# Patient Record
Sex: Female | Born: 1965 | Race: Black or African American | Hispanic: No
Health system: Southern US, Community
[De-identification: ages and names within clinical notes are randomized; demographics above are authoritative.]

## PROBLEM LIST (undated history)

## (undated) DIAGNOSIS — I10 Essential (primary) hypertension: Secondary | ICD-10-CM

## (undated) DIAGNOSIS — R569 Unspecified convulsions: Secondary | ICD-10-CM

## (undated) DIAGNOSIS — E119 Type 2 diabetes mellitus without complications: Secondary | ICD-10-CM

---

## 2019-04-03 ENCOUNTER — Encounter (HOSPITAL_COMMUNITY): Payer: Self-pay

## 2019-04-03 ENCOUNTER — Other Ambulatory Visit: Payer: Self-pay

## 2019-04-03 ENCOUNTER — Emergency Department (HOSPITAL_COMMUNITY)
Admission: EM | Admit: 2019-04-03 | Discharge: 2019-04-03 | Disposition: A | Discharge status: Home / Self Care | Payer: Medicaid - Out of State | Attending: Emergency Medicine | Admitting: Emergency Medicine

## 2019-04-03 ENCOUNTER — Emergency Department (HOSPITAL_COMMUNITY): Payer: Medicaid - Out of State

## 2019-04-03 DIAGNOSIS — I10 Essential (primary) hypertension: Secondary | ICD-10-CM | POA: Insufficient documentation

## 2019-04-03 DIAGNOSIS — E119 Type 2 diabetes mellitus without complications: Secondary | ICD-10-CM | POA: Diagnosis not present

## 2019-04-03 DIAGNOSIS — S138XXA Sprain of joints and ligaments of other parts of neck, initial encounter: Secondary | ICD-10-CM | POA: Insufficient documentation

## 2019-04-03 DIAGNOSIS — Y92410 Unspecified street and highway as the place of occurrence of the external cause: Secondary | ICD-10-CM | POA: Insufficient documentation

## 2019-04-03 DIAGNOSIS — Y93I9 Activity, other involving external motion: Secondary | ICD-10-CM | POA: Diagnosis not present

## 2019-04-03 DIAGNOSIS — Y999 Unspecified external cause status: Secondary | ICD-10-CM | POA: Diagnosis not present

## 2019-04-03 DIAGNOSIS — G44319 Acute post-traumatic headache, not intractable: Secondary | ICD-10-CM | POA: Insufficient documentation

## 2019-04-03 DIAGNOSIS — S139XXA Sprain of joints and ligaments of unspecified parts of neck, initial encounter: Secondary | ICD-10-CM

## 2019-04-03 DIAGNOSIS — S199XXA Unspecified injury of neck, initial encounter: Secondary | ICD-10-CM | POA: Diagnosis present

## 2019-04-03 HISTORY — DX: Unspecified convulsions: R56.9

## 2019-04-03 HISTORY — DX: Type 2 diabetes mellitus without complications: E11.9

## 2019-04-03 HISTORY — DX: Essential (primary) hypertension: I10

## 2019-04-03 LAB — POC URINE PREG, ED: Preg Test, Ur: NEGATIVE

## 2019-04-03 MED ORDER — METHOCARBAMOL 500 MG PO TABS: 500.0000 mg | ORAL_TABLET | Freq: Two times a day (BID) | ORAL | 0 refills | Status: AC

## 2019-04-03 MED ORDER — HYDROCODONE-ACETAMINOPHEN 5-325 MG PO TABS
1.0000 | ORAL_TABLET | Freq: Once | ORAL | Status: AC
  Administered 2019-04-03: 1 via ORAL
  Filled 2019-04-03: qty 1
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
## 2019-04-03 NOTE — Discharge Instructions (Signed)
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
## 2019-04-03 NOTE — ED Notes (Signed)
Discharge instructions and prescription discussed with Pt. Pt verbalized understanding. Pt stable and ambulatory.    

## 2019-04-03 NOTE — ED Notes (Signed)
Patient transported to CT 

## 2019-08-24 NOTE — ED Notes (Signed)
ED Patient Education Note     Patient Education Materials Follows:  Nutrition     Migraine Headache    A migraine headache is very bad, throbbing pain on one or both sides of your head. Talk to your doctor about what things may bring on (trigger) your migraine headaches.      HOME CARE     Only take medicines as told by your doctor.     Lie down in a dark, quiet room when you have a migraine.     Keep a journal to find out if certain things bring on migraine headaches. For example, write down:    ? What you eat and drink.    ? How much sleep you get.    ? Any change to your diet or medicines.     Lessen how much alcohol you drink.     Quit smoking if you smoke.     Get enough sleep.     Lessen any stress in your life.     Keep lights dim if bright lights bother you or make your migraines worse.    GET HELP RIGHT AWAY IF:     Your migraine becomes really bad.     You have a fever.     You have a stiff neck.     You have trouble seeing.     Your muscles are weak, or you lose muscle control.     You lose your balance or have trouble walking.     You feel like you will pass out (faint), or you pass out.     You have really bad symptoms that are different than your first symptoms.    MAKE SURE YOU:     Understand these instructions.      Will watch your condition.     Will get help right away if you are not doing well or get worse.    This information is not intended to replace advice given to you by your health care provider. Make sure you discuss any questions you have with your health care provider.    Document Released: 03/09/2008 Document Revised: 08/23/2011 Document Reviewed: 02/05/2013  Elsevier Interactive Patient Education ?2016 [employer]      Ophthalmology     Bacterial Conjunctivitis    Bacterial conjunctivitis, commonly called pink eye, is an inflammation of the clear membrane that covers the white part of the eye (conjunctiva). The inflammation can also happen on the underside of the eyelids. The blood  vessels in the conjunctiva become inflamed, causing the eye to become red or pink. Bacterial conjunctivitis may spread easily from one eye to another and from person to person (contagious).       CAUSES    Bacterial conjunctivitis is caused by bacteria. The bacteria may come from your own skin, your upper respiratory tract, or from someone else with bacterial conjunctivitis.    SYMPTOMS    The normally white color of the eye or the underside of the eyelid is usually pink or red. The pink eye is usually associated with irritation, tearing, and some sensitivity to light. Bacterial conjunctivitis is often associated with a thick, yellowish discharge from the eye. The discharge may turn into a crust on the eyelids overnight, which causes your eyelids to stick together. If a discharge is present, there may also be some blurred vision in the affected eye.    DIAGNOSIS    Bacterial conjunctivitis is diagnosed by your caregiver through an  eye exam and the symptoms that you report. Your caregiver looks for changes in the surface tissues of your eyes, which may point to the specific type of conjunctivitis. A sample of any discharge may be collected on a cotton-tip swab if you have a severe case of conjunctivitis, if your cornea is affected, or if you keep getting repeat infections that do not respond to treatment. The sample will be sent to a lab to see if the inflammation is caused by a bacterial infection and to see if the infection will respond to antibiotic medicines.    TREATMENT     Bacterial conjunctivitis is treated with antibiotics. Antibiotic eyedrops are most often used. However, antibiotic ointments are also available. Antibiotics pills are sometimes used. Artificial tears or eye washes may ease discomfort.    HOME CARE INSTRUCTIONS     To ease discomfort, apply a cool, clean washcloth to your eye for 10?20 minutes, 3?4 times a day.     Gently wipe away any drainage from your eye with a warm, wet washcloth or a  cotton ball.     Wash your hands often with soap and water. Use paper towels to dry your hands.     Do not share towels or washcloths. This may spread the infection.     Change or wash your pillowcase every day.     You should not use eye makeup until the infection is gone.     Do not operate machinery or drive if your vision is blurred.     Stop using contact lenses. Ask your caregiver how to sterilize or replace your contacts before using them again. This depends on the type of contact lenses that you use.     When applying medicine to the infected eye, do not touch the edge of your eyelid with the eyedrop bottle or ointment tube.    SEEK IMMEDIATE MEDICAL CARE IF:     Your infection has not improved within 3 days after beginning treatment.     You had yellow discharge from your eye and it returns.     You have increased eye pain.     Your eye redness is spreading.     Your vision becomes blurred.     You have a fever or persistent symptoms for more than 2?3 days.     You have a fever and your symptoms suddenly get worse.     You have facial pain, redness, or swelling.    MAKE SURE YOU:     Understand these instructions.      Will watch your condition.     Will get help right away if you are not doing well or get worse.    This information is not intended to replace advice given to you by your health care provider. Make sure you discuss any questions you have with your health care provider.    Document Released: 05/31/2005 Document Revised: 06/21/2014 Document Reviewed: 03/13/2015  Elsevier Interactive Patient Education ?2016 Elsevier Inc.

## 2019-08-24 NOTE — ED Notes (Signed)
ED Triage Note       ED Triage Adult Entered On:  08/24/2019 2:16 EST    Performed On:  08/24/2019 2:11 EST by Elinor Parkinson, RN, Eduard Clos               Triage   Chief Complaint :   Pt c/o left eye pressure pain & redness. Pt states she had a headache at home, started about 6:30. Pt took prescribed Immatrex. States headache pain decreased but pressure on left side of left eye began about 8:30; Worsens with position.    Numeric Rating Pain Scale :   8   [place] Mode of Arrival :   Private vehicle   Infectious Disease Documentation :   Document assessment   Heart Rate Monitored :   73 bpm   Respiratory Rate :   17 br/min   Systolic Blood Pressure :   315 mmHg   Diastolic Blood Pressure :   84 mmHg   SpO2 :   99 %   Oxygen Therapy :   Room air   Patient presentation :   None of the above   Chief Complaint or Presentation suggest infection :   No   Weight Dosing :   88.9 kg(Converted to: 196 lb 0 oz)    Height :   162 cm(Converted to: 5 ft 4 in)    Body Mass Index Dosing :   34 kg/m2   Elinor Parkinson, RNEduard Clos - 08/24/2019 2:11 EST   DCP GENERIC CODE   Tracking Acuity :   3   Tracking Group :   ED [employer], RN, Eduard Clos - 08/24/2019 2:11 EST   ED General Section :   Document assessment   Pregnancy Status :   Patient denies   Last Menstrual Period :   08/07/2019 EST   ED Allergies Section :   Document assessment   ED Reason for Visit Section :   Document assessment   Cross, RN, Eduard Clos - 08/24/2019 2:11 EST   ID Risk Screen Symptoms   Recent Travel History :   No recent travel   Close Contact with COVID-19 ID :   No   Last 14 days COVID-19 ID :   No   TB Symptom Screen :   No symptoms   C. diff Symptom/History ID :   Neither of the above   Elinor Parkinson, RN, [place] A - 08/24/2019 2:11 EST   Allergies   (As Of: 08/24/2019 02:16:58 EST)   Allergies (Active)   sulfa drugs  Estimated Onset Date:   Unspecified ; Reactions:   Rash ; Created By:   Elinor Parkinson RN, Eduard Clos; Reaction Status:   Active ; Category:   Drug ;  Substance:   sulfa drugs ; Type:   Allergy ; Severity:   Moderate ; Updated By:   Elinor Parkinson RN, Eduard Clos; Reviewed Date:   08/24/2019 2:12 EST        Psycho-Social   Last 3 mo, thoughts killing self/others :   Patient denies   Elinor Parkinson, RNEduard Clos - 08/24/2019 2:11 EST   ED Reason for Visit   (As Of: 08/24/2019 02:16:58 EST)   Diagnoses(Active)    Eye pain  Date:   08/24/2019 ; Diagnosis Type:   Reason For Visit ; Confirmation:   Complaint of ; Clinical Dx:   Eye pain ; Classification:   Medical ; Clinical Service:   Emergency medicine ; Code:  PNED ; Probability:   0 ; Diagnosis Code:   [id number]      Headache  Date:   08/24/2019 ; Diagnosis Type:   Reason For Visit ; Confirmation:   Complaint of ; Clinical Dx:   Headache ; Classification:   Medical ; Clinical Service:   Emergency medicine ; Code:   PNED ; Probability:   0 ; Diagnosis Code:   [phone]

## 2019-08-24 NOTE — ED Notes (Signed)
ED Patient Education Note     Patient Education Materials Follows:  Nutrition     Migraine Headache    A migraine headache is very bad, throbbing pain on one or both sides of your head. Talk to your doctor about what things may bring on (trigger) your migraine headaches.      HOME CARE     Only take medicines as told by your doctor.     Lie down in a dark, quiet room when you have a migraine.     Keep a journal to find out if certain things bring on migraine headaches. For example, write down:    ? What you eat and drink.    ? How much sleep you get.    ? Any change to your diet or medicines.     Lessen how much alcohol you drink.     Quit smoking if you smoke.     Get enough sleep.     Lessen any stress in your life.     Keep lights dim if bright lights bother you or make your migraines worse.    GET HELP RIGHT AWAY IF:     Your migraine becomes really bad.     You have a fever.     You have a stiff neck.     You have trouble seeing.     Your muscles are weak, or you lose muscle control.     You lose your balance or have trouble walking.     You feel like you will pass out (faint), or you pass out.     You have really bad symptoms that are different than your first symptoms.    MAKE SURE YOU:     Understand these instructions.      Will watch your condition.     Will get help right away if you are not doing well or get worse.    This information is not intended to replace advice given to you by your health care provider. Make sure you discuss any questions you have with your health care provider.    Document Released: 03/09/2008 Document Revised: 08/23/2011 Document Reviewed: 02/05/2013  Elsevier Interactive Patient Education ?2016 Elsevier Inc.      Ophthalmology     Bacterial Conjunctivitis    Bacterial conjunctivitis, commonly called pink eye, is an inflammation of the clear membrane that covers the white part of the eye (conjunctiva). The inflammation can also happen on the underside of the eyelids. The blood  vessels in the conjunctiva become inflamed, causing the eye to become red or pink. Bacterial conjunctivitis may spread easily from one eye to another and from person to person (contagious).       CAUSES    Bacterial conjunctivitis is caused by bacteria. The bacteria may come from your own skin, your upper respiratory tract, or from someone else with bacterial conjunctivitis.    SYMPTOMS    The normally white color of the eye or the underside of the eyelid is usually pink or red. The pink eye is usually associated with irritation, tearing, and some sensitivity to light. Bacterial conjunctivitis is often associated with a thick, yellowish discharge from the eye. The discharge may turn into a crust on the eyelids overnight, which causes your eyelids to stick together. If a discharge is present, there may also be some blurred vision in the affected eye.    DIAGNOSIS    Bacterial conjunctivitis is diagnosed by your caregiver through an  eye exam and the symptoms that you report. Your caregiver looks for changes in the surface tissues of your eyes, which may point to the specific type of conjunctivitis. A sample of any discharge may be collected on a cotton-tip swab if you have a severe case of conjunctivitis, if your cornea is affected, or if you keep getting repeat infections that do not respond to treatment. The sample will be sent to a lab to see if the inflammation is caused by a bacterial infection and to see if the infection will respond to antibiotic medicines.    TREATMENT     Bacterial conjunctivitis is treated with antibiotics. Antibiotic eyedrops are most often used. However, antibiotic ointments are also available. Antibiotics pills are sometimes used. Artificial tears or eye washes may ease discomfort.    HOME CARE INSTRUCTIONS     To ease discomfort, apply a cool, clean washcloth to your eye for 10?20 minutes, 3?4 times a day.     Gently wipe away any drainage from your eye with a warm, wet washcloth or a  cotton ball.     Wash your hands often with soap and water. Use paper towels to dry your hands.     Do not share towels or washcloths. This may spread the infection.     Change or wash your pillowcase every day.     You should not use eye makeup until the infection is gone.     Do not operate machinery or drive if your vision is blurred.     Stop using contact lenses. Ask your caregiver how to sterilize or replace your contacts before using them again. This depends on the type of contact lenses that you use.     When applying medicine to the infected eye, do not touch the edge of your eyelid with the eyedrop bottle or ointment tube.    SEEK IMMEDIATE MEDICAL CARE IF:     Your infection has not improved within 3 days after beginning treatment.     You had yellow discharge from your eye and it returns.     You have increased eye pain.     Your eye redness is spreading.     Your vision becomes blurred.     You have a fever or persistent symptoms for more than 2?3 days.     You have a fever and your symptoms suddenly get worse.     You have facial pain, redness, or swelling.    MAKE SURE YOU:     Understand these instructions.      Will watch your condition.     Will get help right away if you are not doing well or get worse.    This information is not intended to replace advice given to you by your health care provider. Make sure you discuss any questions you have with your health care provider.    Document Released: 05/31/2005 Document Revised: 06/21/2014 Document Reviewed: 03/13/2015  Elsevier Interactive Patient Education ?2016 [employer].

## 2019-08-24 NOTE — ED Provider Notes (Signed)
Headache        Patient:   Becky Zimmerman, Becky Zimmerman            MRN: [id number]            FIN: [id number]               Age:   54 years     Sex:  Female     DOB:  [birth year]   Associated Diagnoses:   Migraine headache; Conjunctivitis, acute   Author:   Hilda Lias      Basic Information   Additional information: Chief Complaint from Nursing Triage Note   Chief Complaint  Chief Complaint: Pt c/o left eye pressure pain & redness. Pt states she had a headache at home, started about 6:30. Pt took prescribed Immatrex. States headache pain decreased but pressure on left side of left eye began about 8:30; Worsens with position. (08/24/19 02:11:00).      History of Present Illness   The patient presents with 19-year-old female with a history of migraine headaches had a migraine headache and also noticed some redness and pain in her left eye.  Little bit of tearing.  Hurts when she blinks.  She denies fevers or chills or nausea vomiting.  She took some Imitrex her headache is improved but was worried about her eye..        Review of Systems   Constitutional symptoms:  No fever, no chills.    Eye symptoms:  Vision unchanged, discharge, no diplopia, no blurred vision.    ENMT symptoms:  No ear pain, no sore throat.    Respiratory symptoms:  No shortness of breath, no cough.    Cardiovascular symptoms:  No chest pain, no palpitations.    Gastrointestinal symptoms:  No vomiting, no diarrhea.    Genitourinary symptoms:  No dysuria, no hematuria.    Neurologic symptoms:  No numbness, no tingling, no focal weakness.              Additional review of systems information: All other systems reviewed and otherwise negative.      Health Status   Allergies:    Allergic Reactions (Selected)  Moderate  Sulfa drugs- Rash..      Past Medical/ Family/ Social History   Medical history: Reviewed as documented in chart.   Surgical history: Reviewed as documented in chart.   Family history: Not significant.   Social history: Reviewed as documented  in chart.   Problem list:    Active Problems (3)  Diabetes   Heart palpitations   HTN (hypertension)   .      Physical Examination               Vital Signs   Vital Signs   12/11/5282 1:32 EST Systolic Blood Pressure 440 mmHg    Diastolic Blood Pressure 84 mmHg    Heart Rate Monitored 73 bpm    Respiratory Rate 17 br/min    SpO2 99 %   .   Measurements   08/24/2019 2:16 EST Body Mass Index est meas 33.87 kg/m2    Body Mass Index Measured 33.87 kg/m2   08/24/2019 2:11 EST Height/Length Measured 162 cm    Weight Dosing 88.9 kg   .   Basic Oxygen Information   08/24/2019 2:11 EST Oxygen Therapy Room air    SpO2 99 %   .   General:  Alert, no acute distress.    Skin:  Warm, dry, no rash.  Head:  Atraumatic.   Neck:  Supple, trachea midline.    Eye:  Pupils are equal, round and reactive to light, intact accommodation, extraocular movements are intact, vision unchanged, Method of inspection: Viewed with fluorescein, Tetracaine applied, Eyelids: Normal, Conjunctiva: With conjunctivitis, erythematous, Cornea: Clear, no abrasions, not cloudy, no corneal opacification, Intraocular pressure: Left, 9  mm Hg, not elevated, Retina: Retinal arteries no occlusion, retinal veins no venous pulsations, without retinal hemorrhage, Optic nerve: no disc edema, no papilledema.    Cardiovascular:  Normal peripheral perfusion, No edema.    Respiratory:  Respirations are non-labored.   Back:  Normal range of motion.   Musculoskeletal:  Normal strength, no deformity.    Neurological:  Alert and oriented to person, place, time, and situation, No focal neurological deficit observed, CN II-XII intact.    Psychiatric:  Cooperative.      Medical Decision Making   Rationale:  With a history of migraines had a migraine headache and took some medication with relief.  She has left eye redness and some pain with some tearing it looks more like a conjunctivitis.  Her pressures are good and normal.      Reexamination/ Reevaluation   Vital signs   Basic  Oxygen Information   08/24/2019 2:11 EST Oxygen Therapy Room air    SpO2 99 %         Impression and Plan   Diagnosis   Migraine headache (ICD10-CM G43.909, Discharge, Medical)   Conjunctivitis, acute (ICD10-CM H10.30, Discharge, Medical)   Plan   Condition: Improved.    Disposition: Discharged: to home.    Prescriptions: Launch prescriptions   Pharmacy:  erythromycin 0.5% ophthalmic ointment (Prescribe): 0.5 in, OPHTH, QID, 3.5 g, 0 Refill(s).    Patient was given the following educational materials: Migraine Headache, Easy-to-Read, Bacterial Conjunctivitis.    Follow up with: Follow up with primary care provider In 2 days Return to ED if symptoms worsen.    Counseled: Patient, Regarding treatment plan, Regarding prescription.    Signature Line     Electronically Signed on 08/24/2019 02:33 AM EST   ________________________________________________   Vangie Bicker

## 2019-08-24 NOTE — ED Notes (Signed)
 ED Patient Summary       ;       [facility] Emergency Department  [address], [place], [place] [address]  [phone]  Discharge Instructions (Patient)  _______________________________________     Name: Becky Zimmerman, Becky Zimmerman  DOB:  [birth year]                   MRN: [id number]                   FIN: [phone]  Reason For Visit: Headache; Eye pain; EYE PRESSURE/PAIN  Final Diagnosis: Conjunctivitis, acute; Migraine headache     Visit Date: 08/24/2019 02:00:00  Address: [address] [place] [address]  Phone: [phone]     Emergency Department Providers:         Primary Physician:   SMITTY NORLEEN Becky Zimmerman would like to thank you for allowing us  to assist you with your healthcare needs. The following includes patient education materials and information regarding your injury/illness.     Follow-up Instructions:  You were seen today on an emergency basis. Please contact your primary care doctor for a follow up appointment. If you received a referral to a specialist doctor, it is important you follow-up as instructed.    It is important that you call your follow-up doctor to schedule and confirm the location of your next appointment. Your doctor may practice at multiple locations. The office location of your follow-up appointment may be different to the one written on your discharge instructions.    If you do not have a primary care doctor, please call [phone] for help in finding a [facility] Provider. For help in finding a specialist doctor, please call [phone].    The [facility] "Ask a Nurse" line in staffed by Registered Nurses and is a free service to the community. We are available Monday - Friday from 8am to 5pm to answer your questions about your health. Please call [phone].    If your condition gets worse before your follow-up with your primary care doctor or specialist, please return to the Emergency  Department.      Coronavirus 2019 (COVID-19) Reminders:     Patients aged 65 and older, people with increased risk for severe COVID-19 disease, or frontline workers with increased occupational risk can make an appointment for a COVID-19 vaccine. Patients can contact their [facility] doctors' offices to schedule an appointment to receive the COVID-19 vaccine at the [facility] or send us  an email at [email or web address]. Patients who do not have a [facility] physician can call [phone] to schedule vaccination appointments.            Scan this code with your phone camera to send an email to the address above.          Follow Up Appointments:  Primary Care Provider:      Name: PCP,  NONE      Phone:                  With: Address: When:   Follow up with primary care provider  In 2 days   Comments:   Return to ED if symptoms worsen              Printed Prescriptions:    Patient Education Materials:  Discharge Orders  Discharge Patient 08/24/19 2:33:00 EST         Comment:      Bacterial Conjunctivitis; Migraine Headache, Easy-to-Read     Bacterial Conjunctivitis    Bacterial conjunctivitis, commonly called pink eye, is an inflammation of the clear membrane that covers the white part of the eye (conjunctiva). The inflammation can also happen on the underside of the eyelids. The blood vessels in the conjunctiva become inflamed, causing the eye to become red or pink. Bacterial conjunctivitis may spread easily from one eye to another and from person to person (contagious).       CAUSES    Bacterial conjunctivitis is caused by bacteria. The bacteria may come from your own skin, your upper respiratory tract, or from someone else with bacterial conjunctivitis.    SYMPTOMS    The normally white color of the eye or the underside of the eyelid is usually pink or red. The pink eye is usually associated with irritation, tearing, and some sensitivity to light. Bacterial  conjunctivitis is often associated with a thick, yellowish discharge from the eye. The discharge may turn into a crust on the eyelids overnight, which causes your eyelids to stick together. If a discharge is present, there may also be some blurred vision in the affected eye.    DIAGNOSIS    Bacterial conjunctivitis is diagnosed by your caregiver through an eye exam and the symptoms that you report. Your caregiver looks for changes in the surface tissues of your eyes, which may point to the specific type of conjunctivitis. A sample of any discharge may be collected on a cotton-tip swab if you have a severe case of conjunctivitis, if your cornea is affected, or if you keep getting repeat infections that do not respond to treatment. The sample will be sent to a lab to see if the inflammation is caused by a bacterial infection and to see if the infection will respond to antibiotic medicines.    TREATMENT     Bacterial conjunctivitis is treated with antibiotics. Antibiotic eyedrops are most often used. However, antibiotic ointments are also available. Antibiotics pills are sometimes used. Artificial tears or eye washes may ease discomfort.    HOME CARE INSTRUCTIONS     To ease discomfort, apply a cool, clean washcloth to your eye for 10?20 minutes, 3?4 times a day.     Gently wipe away any drainage from your eye with a warm, wet washcloth or a cotton ball.     Wash your hands often with soap and water. Use paper towels to dry your hands.     Do not share towels or washcloths. This may spread the infection.     Change or wash your pillowcase every day.     You should not use eye makeup until the infection is gone.     Do not operate machinery or drive if your vision is blurred.     Stop using contact lenses. Ask your caregiver how to sterilize or replace your contacts before using them again. This depends on the type of contact lenses that you use.     When applying medicine to the infected eye, do not touch the edge of  your eyelid with the eyedrop bottle or ointment tube.    SEEK IMMEDIATE MEDICAL CARE IF:     Your infection has not improved within 3 days after beginning treatment.     You had yellow discharge from your eye and it returns.     You have increased  eye pain.     Your eye redness is spreading.     Your vision becomes blurred.     You have a fever or persistent symptoms for more than 2?3 days.     You have a fever and your symptoms suddenly get worse.     You have facial pain, redness, or swelling.    MAKE SURE YOU:     Understand these instructions.      Will watch your condition.     Will get help right away if you are not doing well or get worse.    This information is not intended to replace advice given to you by your health care provider. Make sure you discuss any questions you have with your health care provider.    Document Released: 05/31/2005 Document Revised: 06/21/2014 Document Reviewed: 03/13/2015  Elsevier Interactive Patient Education ?2016 Elsevier Inc.       Migraine Headache    A migraine headache is very bad, throbbing pain on one or both sides of your head. Talk to your doctor about what things may bring on (trigger) your migraine headaches.      HOME CARE     Only take medicines as told by your doctor.     Lie down in a dark, quiet room when you have a migraine.     Keep a journal to find out if certain things bring on migraine headaches. For example, write down:    ? What you eat and drink.    ? How much sleep you get.    ? Any change to your diet or medicines.     Lessen how much alcohol you drink.     Quit smoking if you smoke.     Get enough sleep.     Lessen any stress in your life.     Keep lights dim if bright lights bother you or make your migraines worse.    GET HELP RIGHT AWAY IF:     Your migraine becomes really bad.     You have a fever.     You have a stiff neck.     You have trouble seeing.     Your muscles are weak, or you lose muscle control.     You lose your balance or have  trouble walking.     You feel like you will pass out (faint), or you pass out.     You have really bad symptoms that are different than your first symptoms.    MAKE SURE YOU:     Understand these instructions.      Will watch your condition.     Will get help right away if you are not doing well or get worse.    This information is not intended to replace advice given to you by your health care provider. Make sure you discuss any questions you have with your health care provider.    Document Released: 03/09/2008 Document Revised: 08/23/2011 Document Reviewed: 02/05/2013  Elsevier Interactive Patient Education ?2016 Elsevier Inc.         Allergy Info: sulfa drugs     Medication Information:  [facility] ED Physicians provided you with a complete list of medications post discharge, if you have been instructed to stop taking a medication please ensure you also follow up with this information to your Primary Care Physician.  Unless otherwise noted, patient will continue to take medications as prescribed prior to the Emergency Room visit.  Any  specific questions regarding your chronic medications and dosages should be discussed with your physician(s) and pharmacist.          apixaban (Eliquis 5 mg oral tablet) 1 Tabs Oral (given by mouth) 2 times a day.  atenolol (atenolol 50 mg oral tablet) Oral (given by mouth) every day.  dicyclomine (Bentyl 20 mg oral tablet) Oral (given by mouth) 4 times a day.  erythromycin ophthalmic (erythromycin 0.5% ophthalmic ointment) 0.5 Inch Ophthalmic (the eye) 4 times a day. Refills: 0.  glimepiride (glimepiride 1 mg oral tablet) Oral (given by mouth) 2 times a day.  lisinopril Oral (given by mouth) every day.  pantoprazole (pantoprazole 40 mg oral delayed release tablet) Oral (given by mouth) every day.  sitaGLIPtin (Januvia 100 mg oral tablet) 1 Tabs Oral (given by mouth) every day.  SUMAtriptan (Imitrex) once.      Medications Administered During Visit:              Medication Dose  Route   erythromycin ophthalmic 1 app OPHTH   ketorolac 15 mg IM          Major Tests and Procedures:  The following procedures and tests were performed during your ED visit.  COMMON PROCEDURES%>  COMMON PROCEDURES COMMENTS%>          Laboratory Orders  No laboratory orders were placed.              Radiology Orders  No radiology orders were placed.              Patient Care Orders  Name Status Details   Discharge Patient Ordered 08/24/19 2:33:00 EST   ED Assessment Adult Completed 08/24/19 2:16:59 EST, 08/24/19 2:16:59 EST   ED Secondary Triage Completed 08/24/19 2:16:59 EST, 08/24/19 2:16:59 EST   ED Triage Adult Completed 08/24/19 2:00:33 EST, 08/24/19 2:00:33 EST   Patient Specific Fall Safety Measures Completed 08/24/19 2:22:51 EST, Once, 08/24/19 2:22:51 EST, ED Low Fall Risk Documentation       ---------------------------------------------------------------------------------------------------------------------  [facility]) encourages you to self-enroll in the [facility] Patient Portal.  [facility] Patient Portal will allow you to manage your personal health information securely from your own electronic device now and in the future.  To begin your Patient Portal enrollment process, please visit [email or web address] Click on "Sign up now" under [facility].  If you find that you need additional assistance on the [facility] Patient Portal or need a copy of your medical records, please call the [facility] Medical Records Office at [phone].  Comment:

## 2019-08-24 NOTE — ED Notes (Signed)
ED Triage Note       ED Secondary Triage Entered On:  08/24/2019 2:22 EST    Performed On:  08/24/2019 2:17 EST by Elinor Parkinson, RN, Eduard Clos               General Information   Barriers to Learning :   None evident   ED Home Meds Section :   Document assessment   [facility] ED Fall Risk Section :   Document assessment   ED Advance Directives Section :   Document assessment   ED Palliative Screen :   N/A (prefilled for <52yo)   Elinor Parkinson, RN, Eduard Clos - 08/24/2019 2:17 EST   (As Of: 08/24/2019 02:22:50 EST)   Problems(Active)    Diabetes (SNOMED CT  :062694854 )  Name of Problem:   Diabetes ; Recorder:   Elinor Parkinson, RN, Eduard Clos; Confirmation:   Confirmed ; Classification:   Patient Stated ; Code:   [id number] ; Contributor System:   PowerChart ; Last Updated:   08/24/2019 2:17 EST ; Life Cycle Date:   08/24/2019 ; Life Cycle Status:   Active ; Vocabulary:   SNOMED CT        Heart palpitations (SNOMED CT  :381829937 )  Name of Problem:   Heart palpitations ; Recorder:   Elinor Parkinson, RN, Eduard Clos; Confirmation:   Confirmed ; Classification:   Patient Stated ; Code:   [id number] ; Contributor System:   Conservation officer, nature ; Last Updated:   08/24/2019 2:17 EST ; Life Cycle Date:   08/24/2019 ; Life Cycle Status:   Active ; Vocabulary:   SNOMED CT        HTN (hypertension) (SNOMED CT  :1017510258 )  Name of Problem:   HTN (hypertension) ; Recorder:   Elinor Parkinson, RN, Eduard Clos; Confirmation:   Confirmed ; Classification:   Patient Stated ; Code:   [id number] ; Contributor System:   Conservation officer, nature ; Last Updated:   08/24/2019 2:17 EST ; Life Cycle Date:   08/24/2019 ; Life Cycle Status:   Active ; Vocabulary:   SNOMED CT          Diagnoses(Active)    Eye pain  Date:   08/24/2019 ; Diagnosis Type:   Reason For Visit ; Confirmation:   Complaint of ; Clinical Dx:   Eye pain ; Classification:   Medical ; Clinical Service:   Emergency medicine ; Code:   PNED ; Probability:   0 ; Diagnosis Code:   [id number]      Headache  Date:   08/24/2019 ;  Diagnosis Type:   Reason For Visit ; Confirmation:   Complaint of ; Clinical Dx:   Headache ; Classification:   Medical ; Clinical Service:   Emergency medicine ; Code:   PNED ; Probability:   0 ; Diagnosis Code:   [id number]             -    Procedure History   (As Of: 08/24/2019 02:22:50 EST)     Anesthesia Minutes:   0 ; Procedure Name:   Cholecystectomy ; Procedure Minutes:   0 ; Last Reviewed Dt/Tm:   08/24/2019 02:18:11 EST            Anesthesia Minutes:   0 ; Procedure Name:   Hernia repair ; Procedure Minutes:   0 ; Last Reviewed Dt/Tm:   08/24/2019 02:18:18 EST            [facility] Fall Risk Assessment Tool   Hx of falling last 3  months ED Fall :   No   Patient confused or disoriented ED Fall :   No   Patient intoxicated or sedated ED Fall :   No   Patient impaired gait ED Fall :   Yes   Use a mobility assistance device ED Fall :   Yes   Patient altered elimination ED Fall :   No   [facility] ED Fall Score :   2    Jed Limerick, RN, Ginette Pitman - 08/24/2019 2:17 EST   ED Advance Directive   Advance Directive :   No   Cross, RN, Ginette Pitman - 08/24/2019 2:17 EST   Med Hx   Medication List   (As Of: 08/24/2019 02:22:50 EST)   Home Meds    apixaban  :   apixaban ; Status:   Documented ; Ordered As Mnemonic:   Eliquis 5 mg oral tablet ; Simple Display Line:   5 mg, 1 tabs, Oral, BID, 60 tabs, 0 Refill(s) ; Catalog Code:   apixaban ; Order Dt/Tm:   08/24/2019 02:22:39 EST          SUMAtriptan  :   SUMAtriptan ; Status:   Documented ; Ordered As Mnemonic:   Imitrex ; Simple Display Line:   Once, 0 Refill(s) ; Catalog Code:   SUMAtriptan ; Order Dt/Tm:   08/24/2019 02:22:14 EST          dicyclomine  :   dicyclomine ; Status:   Documented ; Ordered As Mnemonic:   Bentyl 20 mg oral tablet ; Simple Display Line:   mg, tabs, Oral, QID, 0 Refill(s) ; Catalog Code:   dicyclomine ; Order Dt/Tm:   08/24/2019 02:21:39 EST          glimepiride  :   glimepiride ; Status:   Documented ; Ordered As Mnemonic:   glimepiride  1 mg oral tablet ; Simple Display Line:   mg, tabs, Oral, BID, 0 Refill(s) ; Catalog Code:   glimepiride ; Order Dt/Tm:   08/24/2019 02:20:14 EST          pantoprazole  :   pantoprazole ; Status:   Documented ; Ordered As Mnemonic:   pantoprazole 40 mg oral delayed release tablet ; Simple Display Line:   mg, tabs, Oral, Daily, 0 Refill(s) ; Catalog Code:   pantoprazole ; Order Dt/Tm:   08/24/2019 02:20:55 EST          atenolol  :   atenolol ; Status:   Documented ; Ordered As Mnemonic:   atenolol 50 mg oral tablet ; Simple Display Line:   mg, tabs, Oral, Daily, 0 Refill(s) ; Catalog Code:   atenolol ; Order Dt/Tm:   08/24/2019 02:19:07 EST          lisinopril  :   lisinopril ; Status:   Documented ; Ordered As Mnemonic:   lisinopril ; Simple Display Line:   Oral, Daily, 0 Refill(s) ; Catalog Code:   lisinopril ; Order Dt/Tm:   08/24/2019 02:19:28 EST          sitaGLIPtin  :   sitaGLIPtin ; Status:   Documented ; Ordered As Mnemonic:   Januvia 100 mg oral tablet ; Simple Display Line:   100 mg, 1 tabs, Oral, Daily, 30 tabs, 0 Refill(s) ; Catalog Code:   sitaGLIPtin ; Order Dt/Tm:   08/24/2019 02:18:57 EST

## 2019-08-24 NOTE — Discharge Summary (Signed)
ED Clinical Summary                        [facility]  [address]  [place], [place] [address]  [phone]           PERSON INFORMATION  Name: Becky Zimmerman, Becky Zimmerman Age:  54 Years DOB: [birth year]   Sex: Female Language: English PCP: PCP,  NONE   Marital Status: Divorced Phone: [phone] Med Service: MED-Medicine   MRN: [id number] Acct# [id number] Arrival: 08/24/2019 02:00:00   Visit Reason: Headache; Eye pain; EYE PRESSURE/PAIN Acuity: 3 LOS: 000 00:50   Address:    [address] [place] [address]   Diagnosis:    Conjunctivitis, acute; Migraine headache  Medications:          New Medications  Printed Prescriptions  erythromycin ophthalmic (erythromycin 0.5% ophthalmic ointment) 0.5 Inch Ophthalmic (the eye) 4 times a day. Refills: 0.  Last Dose:____________________  Medications that have not changed  Other Medications  apixaban (Eliquis 5 mg oral tablet) 1 Tabs Oral (given by mouth) 2 times a day.  Last Dose:____________________  atenolol (atenolol 50 mg oral tablet) Oral (given by mouth) every day.  Last Dose:____________________  dicyclomine (Bentyl 20 mg oral tablet) Oral (given by mouth) 4 times a day.  Last Dose:____________________  glimepiride (glimepiride 1 mg oral tablet) Oral (given by mouth) 2 times a day.  Last Dose:____________________  lisinopril Oral (given by mouth) every day.  Last Dose:____________________  pantoprazole (pantoprazole 40 mg oral delayed release tablet) Oral (given by mouth) every day.  Last Dose:____________________  sitaGLIPtin (Januvia 100 mg oral tablet) 1 Tabs Oral (given by mouth) every day.  Last Dose:____________________  SUMAtriptan (Imitrex) once.  Last Dose:____________________      Medications Administered During Visit:                Medication Dose Route   erythromycin ophthalmic 1 app OPHTH   ketorolac 15 mg IM               Allergies      sulfa drugs (Rash)      Major Tests and Procedures:  The following procedures and tests were performed  during your ED visit.  COMMON PROCEDURES%>  COMMON PROCEDURES COMMENTS%>                PROVIDER INFORMATION               Provider Role Assigned Carmon Ginsberg, [place] ED Provider 08/24/2019 02:05:32    Jed Limerick, RN, Ginette Pitman ED Nurse 08/24/2019 02:09:45        Attending Physician:  Vangie Bicker      Admit Doc  Laurine Blazer,  JOHN-MD     Consulting Doc       VITALS INFORMATION  Vital Sign Triage Latest   Temp Oral ORAL_1%> ORAL%>   Temp Temporal TEMPORAL_1%> TEMPORAL%>   Temp Intravascular INTRAVASCULAR_1%> INTRAVASCULAR%>   Temp Axillary AXILLARY_1%> AXILLARY%>   Temp Rectal RECTAL_1%> RECTAL%>   02 Sat 99 % 100 %   Respiratory Rate RATE_1%> RATE%>   Peripheral Pulse Rate PULSE RATE_1%> PULSE RATE%>   Apical Heart Rate HEART RATE_1%> HEART RATE%>   Blood Pressure BLOOD PRESSURE_1%>/ BLOOD PRESSURE_1%>84 mmHg BLOOD PRESSURE%> / BLOOD PRESSURE%>106 mmHg                 Immunizations      No Immunizations Documented This Visit  DISCHARGE INFORMATION   Discharge Disposition: H Outpt-Sent Home   Discharge Location:  Home   Discharge Date and Time:  08/24/2019 02:50:20   ED Checkout Date and Time:  08/24/2019 02:50:20     DEPART REASON INCOMPLETE INFORMATION               Depart Action Incomplete Reason   Interactive View/I&O Recently assessed               Problems      No Problems Documented              Smoking Status      No Smoking Status Documented         PATIENT EDUCATION INFORMATION  Instructions:     Bacterial Conjunctivitis; Migraine Headache, Easy-to-Read     Follow up:                   With: Address: When:   Follow up with primary care provider  In 2 days   Comments:   Return to ED if symptoms worsen              ED PROVIDER DOCUMENTATION     Patient:   Becky Zimmerman, Becky Zimmerman            MRN: [id number]            FIN: [id number]               Age:   64 years     Sex:  Female     DOB:  December 26, 1965   Associated Diagnoses:   Migraine headache; Conjunctivitis, acute   Author:   Vangie Bicker      Basic  Information   Additional information: Chief Complaint from Nursing Triage Note   Chief Complaint  Chief Complaint: Pt c/o left eye pressure pain & redness. Pt states she had a headache at home, started about 6:30. Pt took prescribed Immatrex. States headache pain decreased but pressure on left side of left eye began about 8:30; Worsens with position. (08/24/19 02:11:00).      History of Present Illness   The patient presents with 78-year-old female with a history of migraine headaches had a migraine headache and also noticed some redness and pain in her left eye.  Little bit of tearing.  Hurts when she blinks.  She denies fevers or chills or nausea vomiting.  She took some Imitrex her headache is improved but was worried about her eye..        Review of Systems   Constitutional symptoms:  No fever, no chills.    Eye symptoms:  Vision unchanged, discharge, no diplopia, no blurred vision.    ENMT symptoms:  No ear pain, no sore throat.    Respiratory symptoms:  No shortness of breath, no cough.    Cardiovascular symptoms:  No chest pain, no palpitations.    Gastrointestinal symptoms:  No vomiting, no diarrhea.    Genitourinary symptoms:  No dysuria, no hematuria.    Neurologic symptoms:  No numbness, no tingling, no focal weakness.              Additional review of systems information: All other systems reviewed and otherwise negative.      Health Status   Allergies:    Allergic Reactions (Selected)  Moderate  Sulfa drugs- Rash..      Past Medical/ Family/ Social History   Medical history: Reviewed as documented in chart.   Surgical history: Reviewed  as documented in chart.   Family history: Not significant.   Social history: Reviewed as documented in chart.   Problem list:    Active Problems (3)  Diabetes   Heart palpitations   HTN (hypertension)   .      Physical Examination               Vital Signs   Vital Signs   1/61/0960 4:54 EST Systolic Blood Pressure 098 mmHg    Diastolic Blood Pressure 84 mmHg    Heart Rate  Monitored 73 bpm    Respiratory Rate 17 br/min    SpO2 99 %   .   Measurements   08/24/2019 2:16 EST Body Mass Index est meas 33.87 kg/m2    Body Mass Index Measured 33.87 kg/m2   08/24/2019 2:11 EST Height/Length Measured 162 cm    Weight Dosing 88.9 kg   .   Basic Oxygen Information   08/24/2019 2:11 EST Oxygen Therapy Room air    SpO2 99 %   .   General:  Alert, no acute distress.    Skin:  Warm, dry, no rash.    Head:  Atraumatic.   Neck:  Supple, trachea midline.    Eye:  Pupils are equal, round and reactive to light, intact accommodation, extraocular movements are intact, vision unchanged, Method of inspection: Viewed with fluorescein, Tetracaine applied, Eyelids: Normal, Conjunctiva: With conjunctivitis, erythematous, Cornea: Clear, no abrasions, not cloudy, no corneal opacification, Intraocular pressure: Left, 9  mm Hg, not elevated, Retina: Retinal arteries no occlusion, retinal veins no venous pulsations, without retinal hemorrhage, Optic nerve: no disc edema, no papilledema.    Cardiovascular:  Normal peripheral perfusion, No edema.    Respiratory:  Respirations are non-labored.   Back:  Normal range of motion.   Musculoskeletal:  Normal strength, no deformity.    Neurological:  Alert and oriented to person, place, time, and situation, No focal neurological deficit observed, CN II-XII intact.    Psychiatric:  Cooperative.      Medical Decision Making   Rationale:  With a history of migraines had a migraine headache and took some medication with relief.  She has left eye redness and some pain with some tearing it looks more like a conjunctivitis.  Her pressures are good and normal.      Reexamination/ Reevaluation   Vital signs   Basic Oxygen Information   08/24/2019 2:11 EST Oxygen Therapy Room air    SpO2 99 %         Impression and Plan   Diagnosis   Migraine headache (ICD10-CM G43.909, Discharge, Medical)   Conjunctivitis, acute (ICD10-CM H10.30, Discharge, Medical)   Plan   Condition: Improved.     Disposition: Discharged: to home.    Prescriptions: Launch prescriptions   Pharmacy:  erythromycin 0.5% ophthalmic ointment (Prescribe): 0.5 in, OPHTH, QID, 3.5 g, 0 Refill(s).    Patient was given the following educational materials: Migraine Headache, Easy-to-Read, Bacterial Conjunctivitis.    Follow up with: Follow up with primary care provider In 2 days Return to ED if symptoms worsen.    Counseled: Patient, Regarding treatment plan, Regarding prescription.

## 2021-05-28 IMAGING — CT CT CERVICAL SPINE W/O CM
3 of 5 series · 11 of 33 positions shown, 13 images · non-contrast
Comparison: None.

CLINICAL DATA: Headache, posttraumatic

EXAM:
CT HEAD WITHOUT CONTRAST
TECHNIQUE: Contiguous axial images were obtained from the base of the skull
through the vertex without intravenous contrast.

[Series 6: c_spine 1.0 st thins · axial · 0.35mm/px · z∈[-196,-76]mm · 3 of 303 slices shown, 4 images]
[im 87/303  soft-tissue]
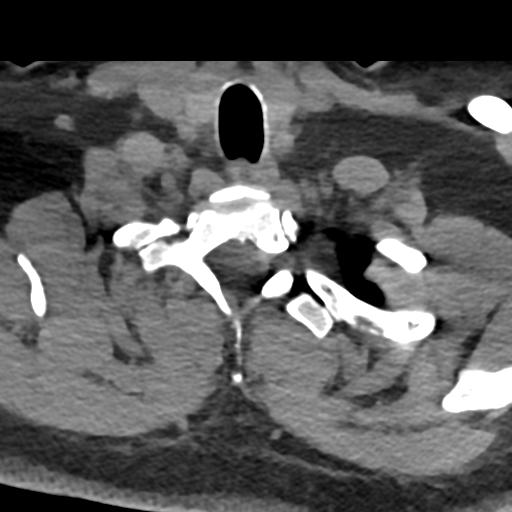
[im 87/303  bone]
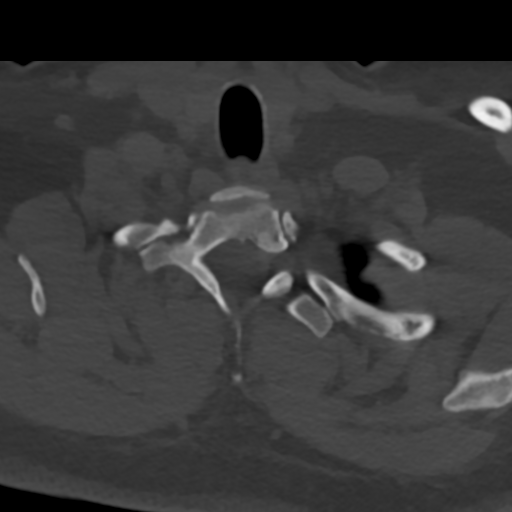
[im 173/303  bone]
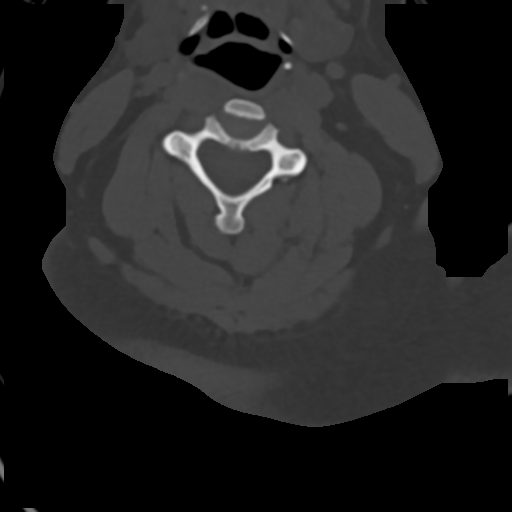
[im 259/303  bone]
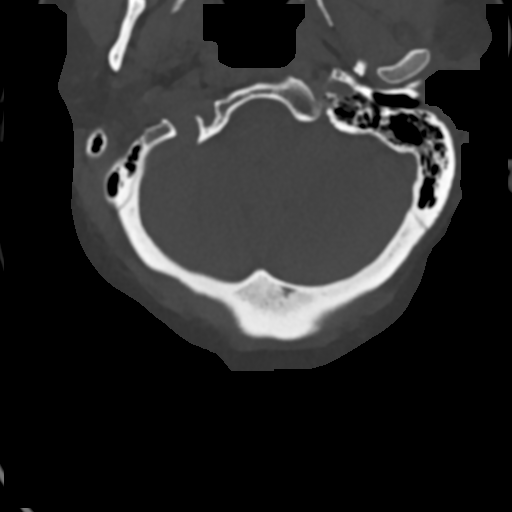

[Series 8: c_spine 2.0 sag bone · sagittal · 0.31mm/px · 5 of 61 slices shown, 6 images]
[im 21/61  bone]
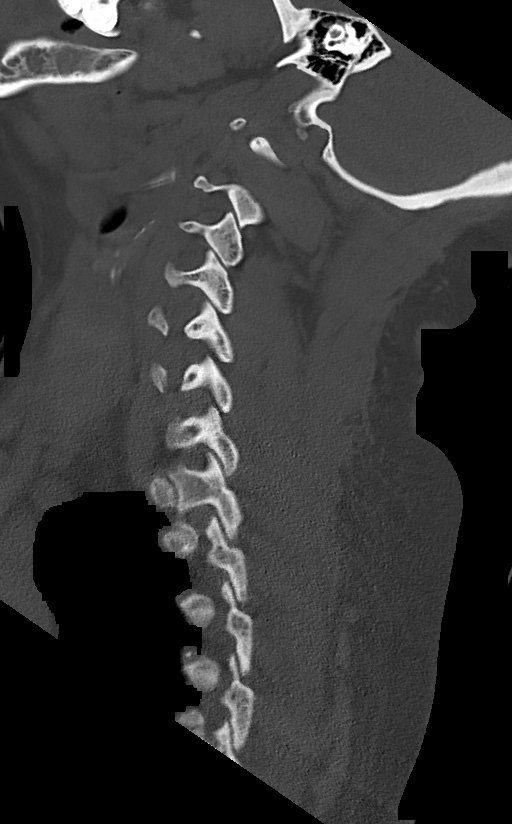
[im 26/61  bone]
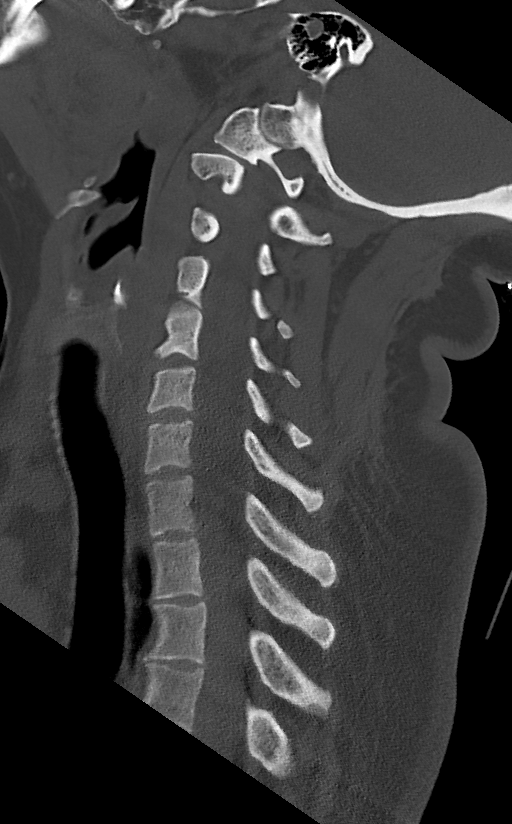
[im 31/61  soft-tissue]
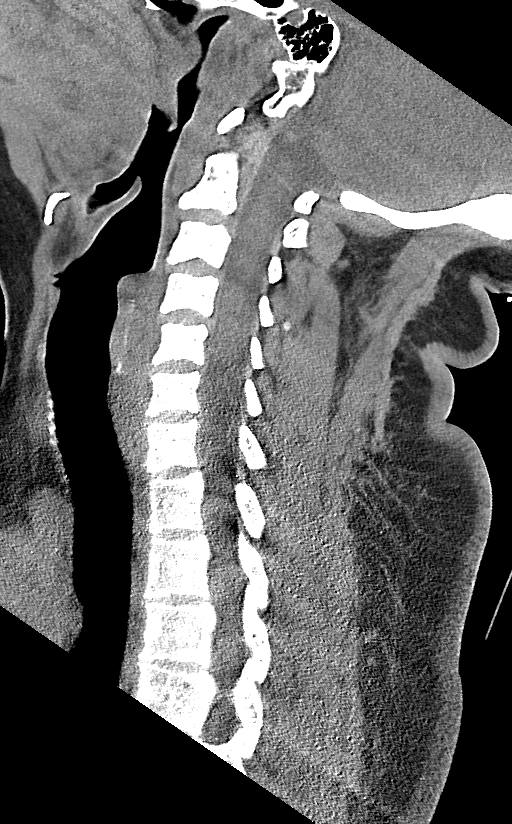
[im 31/61  bone]
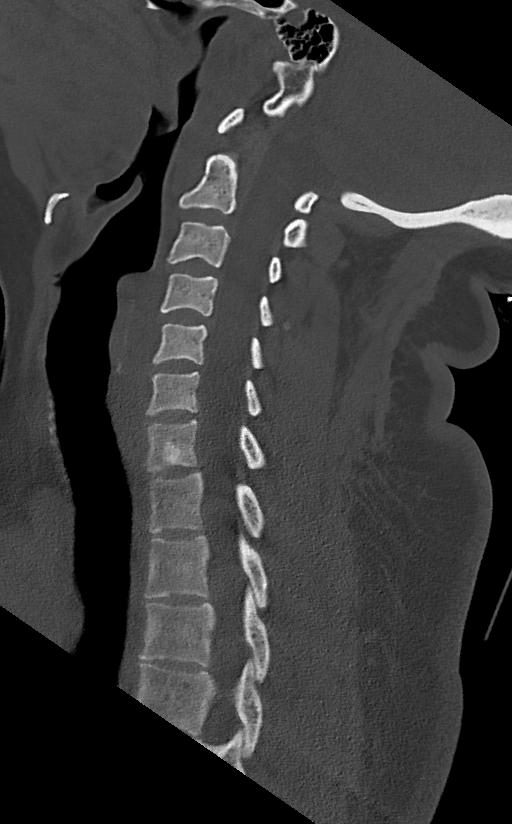
[im 36/61  bone]
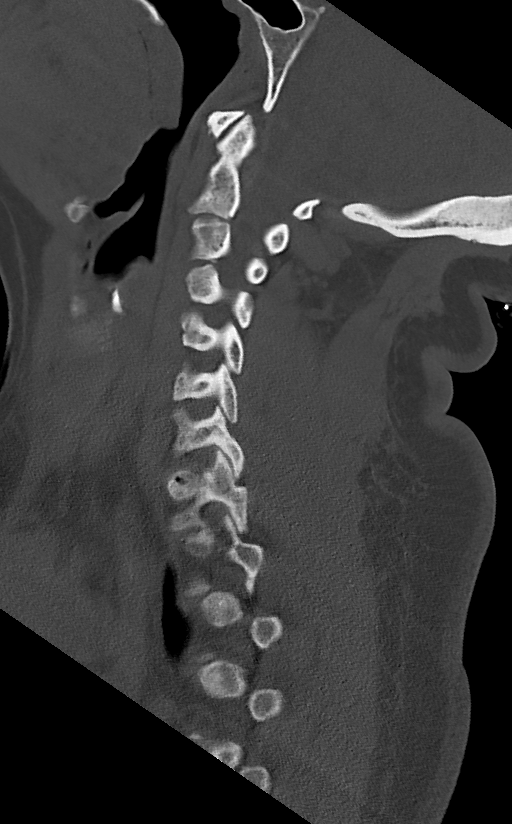
[im 41/61  bone]
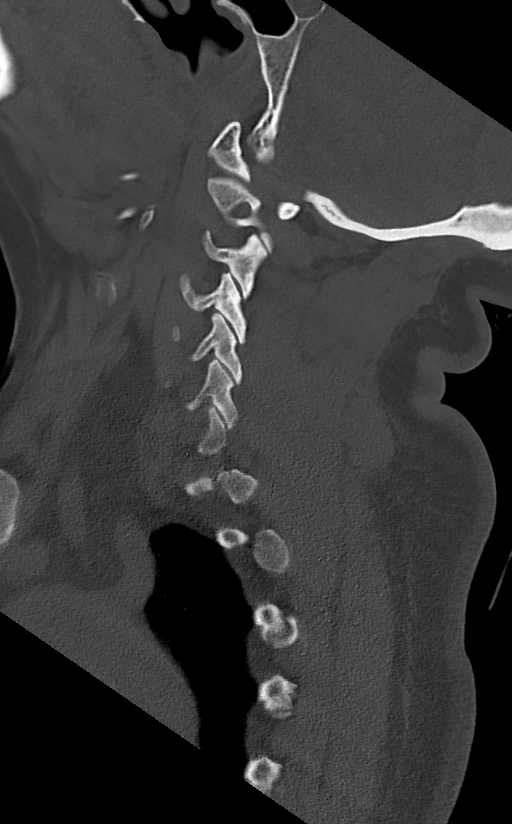

[Series 9: c_spine 2.0 cor bone · coronal · 0.25mm/px · 3 of 61 slices shown]
[im 17/61  bone]
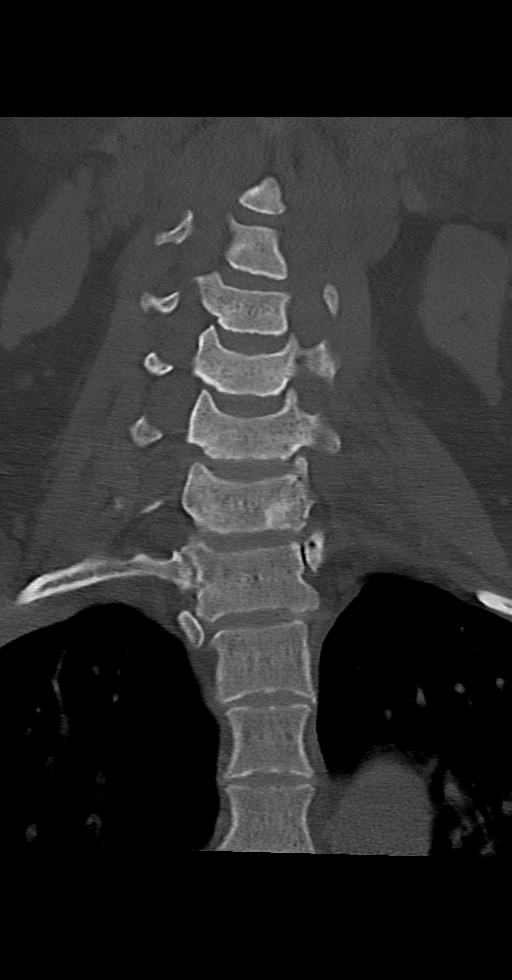
[im 26/61  bone]
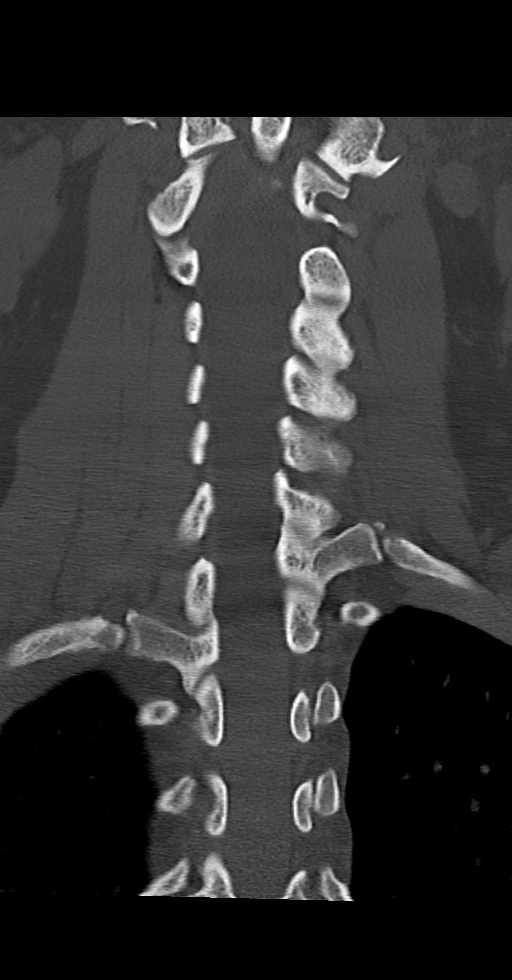
[im 35/61  bone]
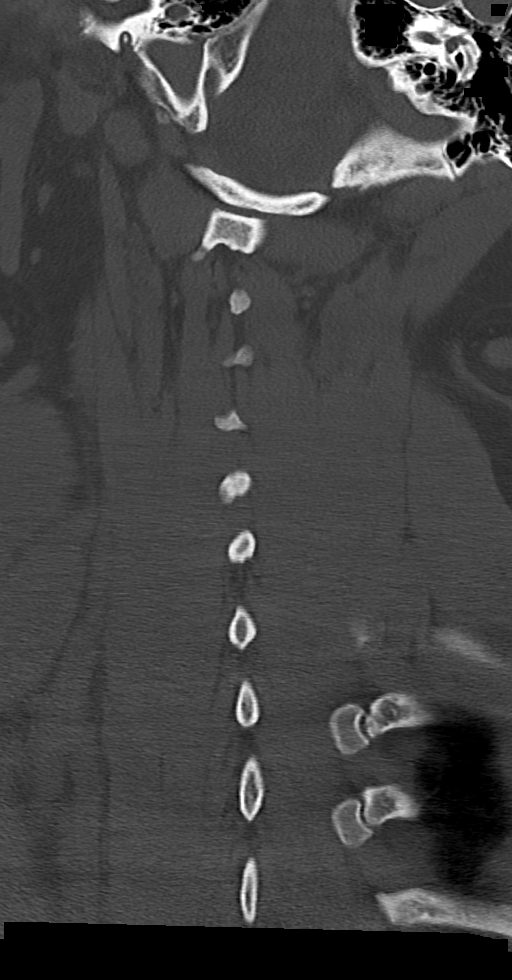

[11 of 33 positions shown; findings below may reference images not displayed]

FINDINGS: Brain: No evidence of acute territorial infarction, hemorrhage,
hydrocephalus,extra-axial collection or mass lesion/mass effect.
Normal gray-white differentiation. Ventricles are normal in size and
contour.

Vascular: No hyperdense vessel or unexpected calcification.

Skull: The skull is intact. No fracture or focal lesion identified.

Sinuses/Orbits: The visualized paranasal sinuses and mastoid air
cells are clear. The orbits and globes intact.

Other: None

Cervical spine:

Alignment: Physiologic

Skull base and vertebrae: Visualized skull base is intact. No
atlanto-occipital dissociation. The vertebral body heights are well
maintained. There is a sclerotic lesion seen within the C7 vertebral
body, likely intraosseous hemangioma or bone island.

Soft tissues and spinal canal: The visualized paraspinal soft
tissues are unremarkable. No prevertebral soft tissue swelling is
seen. The spinal canal is grossly unremarkable, no large epidural
collection or significant canal narrowing.

Disc levels: No significant canal or neural foraminal narrowing is
seen.

Upper chest: The lung apices are clear. Thoracic inlet is within
normal limits.

Other: None
IMPRESSION: No acute intracranial abnormality.

No acute fracture or malalignment of the spine.
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
